# Patient Record
Sex: Female | Born: 1975 | Race: White | Hispanic: No | Marital: Married | Smoking: Never smoker
Health system: Southern US, Community
[De-identification: ages and names within clinical notes are randomized; demographics above are authoritative.]

## PROBLEM LIST (undated history)

## (undated) ENCOUNTER — Emergency Department (HOSPITAL_BASED_OUTPATIENT_CLINIC_OR_DEPARTMENT_OTHER): Admission: EM | Payer: BC Managed Care – PPO | Source: Home / Self Care

## (undated) DIAGNOSIS — A6920 Lyme disease, unspecified: Secondary | ICD-10-CM

## (undated) DIAGNOSIS — I319 Disease of pericardium, unspecified: Secondary | ICD-10-CM

## (undated) DIAGNOSIS — C50919 Malignant neoplasm of unspecified site of unspecified female breast: Secondary | ICD-10-CM

## (undated) HISTORY — DX: Lyme disease, unspecified: A69.20

## (undated) HISTORY — PX: APPENDECTOMY: SHX54

## (undated) HISTORY — DX: Disease of pericardium, unspecified: I31.9

## (undated) HISTORY — DX: Malignant neoplasm of unspecified site of unspecified female breast: C50.919

---

## 2016-02-28 HISTORY — PX: BILATERAL TOTAL MASTECTOMY WITH AXILLARY LYMPH NODE DISSECTION: SHX6364

## 2019-03-02 DIAGNOSIS — R1032 Left lower quadrant pain: Secondary | ICD-10-CM | POA: Diagnosis not present

## 2019-03-02 DIAGNOSIS — Z853 Personal history of malignant neoplasm of breast: Secondary | ICD-10-CM | POA: Diagnosis not present

## 2019-03-03 ENCOUNTER — Other Ambulatory Visit: Payer: Self-pay | Admitting: Family Medicine

## 2019-03-03 DIAGNOSIS — R1032 Left lower quadrant pain: Secondary | ICD-10-CM

## 2019-03-04 ENCOUNTER — Other Ambulatory Visit: Payer: Self-pay | Admitting: Family Medicine

## 2019-03-04 DIAGNOSIS — Z853 Personal history of malignant neoplasm of breast: Secondary | ICD-10-CM

## 2019-03-15 ENCOUNTER — Ambulatory Visit
Admission: RE | Admit: 2019-03-15 | Discharge: 2019-03-15 | Disposition: A | Discharge status: Home / Self Care | Payer: BC Managed Care – PPO | Source: Ambulatory Visit | Attending: Family Medicine | Admitting: Family Medicine

## 2019-03-15 DIAGNOSIS — R1032 Left lower quadrant pain: Secondary | ICD-10-CM | POA: Diagnosis not present

## 2019-03-15 DIAGNOSIS — R102 Pelvic and perineal pain: Secondary | ICD-10-CM | POA: Diagnosis not present

## 2019-03-22 ENCOUNTER — Other Ambulatory Visit: Payer: Self-pay

## 2019-03-25 ENCOUNTER — Ambulatory Visit
Admission: RE | Admit: 2019-03-25 | Discharge: 2019-03-25 | Disposition: A | Discharge status: Home / Self Care | Payer: BC Managed Care – PPO | Source: Ambulatory Visit | Attending: Family Medicine | Admitting: Family Medicine

## 2019-03-25 ENCOUNTER — Other Ambulatory Visit: Payer: Self-pay

## 2019-03-25 DIAGNOSIS — Z853 Personal history of malignant neoplasm of breast: Secondary | ICD-10-CM

## 2019-03-25 DIAGNOSIS — N6320 Unspecified lump in the left breast, unspecified quadrant: Secondary | ICD-10-CM | POA: Diagnosis not present

## 2019-03-25 MED ORDER — GADOBUTROL 1 MMOL/ML IV SOLN
7.0000 mL | Freq: Once | INTRAVENOUS | Status: AC | PRN
  Administered 2019-03-25: 7 mL via INTRAVENOUS

## 2019-03-29 ENCOUNTER — Other Ambulatory Visit: Payer: Self-pay | Admitting: Family Medicine

## 2019-03-29 DIAGNOSIS — N6320 Unspecified lump in the left breast, unspecified quadrant: Secondary | ICD-10-CM

## 2019-04-12 DIAGNOSIS — L720 Epidermal cyst: Secondary | ICD-10-CM | POA: Diagnosis not present

## 2019-04-20 ENCOUNTER — Ambulatory Visit
Admission: RE | Admit: 2019-04-20 | Discharge: 2019-04-20 | Disposition: A | Discharge status: Home / Self Care | Payer: BC Managed Care – PPO | Source: Ambulatory Visit | Attending: Family Medicine | Admitting: Family Medicine

## 2019-04-20 ENCOUNTER — Other Ambulatory Visit: Payer: Self-pay | Admitting: Family Medicine

## 2019-04-20 ENCOUNTER — Other Ambulatory Visit: Payer: Self-pay

## 2019-04-20 DIAGNOSIS — N6320 Unspecified lump in the left breast, unspecified quadrant: Secondary | ICD-10-CM

## 2019-04-20 DIAGNOSIS — N6321 Unspecified lump in the left breast, upper outer quadrant: Secondary | ICD-10-CM | POA: Diagnosis not present

## 2019-04-26 ENCOUNTER — Other Ambulatory Visit: Payer: Self-pay

## 2019-04-26 ENCOUNTER — Ambulatory Visit
Admission: RE | Admit: 2019-04-26 | Discharge: 2019-04-26 | Disposition: A | Discharge status: Home / Self Care | Payer: BC Managed Care – PPO | Source: Ambulatory Visit | Attending: Family Medicine | Admitting: Family Medicine

## 2019-04-26 ENCOUNTER — Other Ambulatory Visit: Payer: Self-pay | Admitting: Family Medicine

## 2019-04-26 DIAGNOSIS — N6321 Unspecified lump in the left breast, upper outer quadrant: Secondary | ICD-10-CM | POA: Diagnosis not present

## 2019-04-26 DIAGNOSIS — N6320 Unspecified lump in the left breast, unspecified quadrant: Secondary | ICD-10-CM

## 2019-04-26 DIAGNOSIS — Z853 Personal history of malignant neoplasm of breast: Secondary | ICD-10-CM | POA: Diagnosis not present

## 2019-05-25 DIAGNOSIS — R5383 Other fatigue: Secondary | ICD-10-CM | POA: Diagnosis not present

## 2019-05-25 DIAGNOSIS — Z0000 Encounter for general adult medical examination without abnormal findings: Secondary | ICD-10-CM | POA: Diagnosis not present

## 2019-05-25 DIAGNOSIS — Z13220 Encounter for screening for lipoid disorders: Secondary | ICD-10-CM | POA: Diagnosis not present

## 2019-07-07 DIAGNOSIS — Z20828 Contact with and (suspected) exposure to other viral communicable diseases: Secondary | ICD-10-CM | POA: Diagnosis not present

## 2019-07-07 DIAGNOSIS — Z20822 Contact with and (suspected) exposure to covid-19: Secondary | ICD-10-CM | POA: Diagnosis not present

## 2019-12-12 DIAGNOSIS — Z20822 Contact with and (suspected) exposure to covid-19: Secondary | ICD-10-CM | POA: Diagnosis not present

## 2019-12-15 ENCOUNTER — Telehealth (HOSPITAL_COMMUNITY): Payer: Self-pay

## 2019-12-15 ENCOUNTER — Other Ambulatory Visit: Payer: Self-pay | Admitting: Adult Health

## 2019-12-15 ENCOUNTER — Ambulatory Visit (HOSPITAL_COMMUNITY)
Admission: RE | Admit: 2019-12-15 | Discharge: 2019-12-15 | Disposition: A | Discharge status: Home / Self Care | Payer: BC Managed Care – PPO | Source: Ambulatory Visit | Attending: Pulmonary Disease | Admitting: Pulmonary Disease

## 2019-12-15 DIAGNOSIS — U071 COVID-19: Secondary | ICD-10-CM

## 2019-12-15 MED ORDER — FAMOTIDINE IN NACL 20-0.9 MG/50ML-% IV SOLN: 20.0000 mg | Freq: Once | INTRAVENOUS | Status: DC | PRN

## 2019-12-15 MED ORDER — SODIUM CHLORIDE 0.9 % IV SOLN: Freq: Once | INTRAVENOUS | Status: AC

## 2019-12-15 MED ORDER — EPINEPHRINE 0.3 MG/0.3ML IJ SOAJ: 0.3000 mg | Freq: Once | INTRAMUSCULAR | Status: DC | PRN

## 2019-12-15 MED ORDER — DIPHENHYDRAMINE HCL 50 MG/ML IJ SOLN: 50.0000 mg | Freq: Once | INTRAMUSCULAR | Status: DC | PRN

## 2019-12-15 MED ORDER — METHYLPREDNISOLONE SODIUM SUCC 125 MG IJ SOLR: 125.0000 mg | Freq: Once | INTRAMUSCULAR | Status: DC | PRN

## 2019-12-15 MED ORDER — ALBUTEROL SULFATE HFA 108 (90 BASE) MCG/ACT IN AERS: 2.0000 | INHALATION_SPRAY | Freq: Once | RESPIRATORY_TRACT | Status: DC | PRN

## 2019-12-15 MED ORDER — ONDANSETRON HCL 4 MG/2ML IJ SOLN
4.0000 mg | Freq: Once | INTRAMUSCULAR | Status: AC
  Administered 2019-12-15: 4 mg via INTRAVENOUS
  Filled 2019-12-15: qty 2

## 2019-12-15 MED ORDER — SODIUM CHLORIDE 0.9 % IV SOLN: INTRAVENOUS | Status: DC | PRN

## 2019-12-15 NOTE — Telephone Encounter (Signed)
Called to Discuss with patient about Covid symptoms and the use of the monoclonal antibody infusion for those with mild to moderate Covid symptoms and at a high risk of hospitalization.     Pt appears to qualify for this infusion due to co-morbid conditions and/or a member of an at-risk group in accordance with the FDA Emergency Use Authorization.    Pt stated her symptoms started on 12/09/19 and she tested positive at [facility] on 12/12/[facility]. Pt states symptoms include fever, chills, & body aches. Pt states her medical history includes breast cancer, pericarditis, and lyme disease. RN informed pt that an APP will call and schedule an appointment.

## 2019-12-15 NOTE — Discharge Instructions (Signed)

## 2019-12-15 NOTE — Progress Notes (Signed)
  Diagnosis: COVID-19  Physician: Dr. Wright  Procedure: Covid Infusion Clinic Med: bamlanivimab\etesevimab infusion - Provided patient with bamlanimivab\etesevimab fact sheet for patients, parents and caregivers prior to infusion.  Complications: No immediate complications noted.  Discharge: Discharged home   Adriana Phelps 12/15/2019  

## 2019-12-15 NOTE — Progress Notes (Signed)
I connected by phone with Adriana Phelps on 12/15/2019 at 3:11 PM to discuss the potential use of a new treatment for mild to moderate COVID-19 viral infection in non-hospitalized patients.  This patient is a 44 y.o. female that meets the FDA criteria for Emergency Use Authorization of COVID monoclonal antibody casirivimab/imdevimab or bamlanivimab/eteseviamb.  Has a (+) direct SARS-CoV-2 viral test result  Has mild or moderate COVID-19   Is NOT hospitalized due to COVID-19  Is within 10 days of symptom onset  Has at least one of the high risk factor(s) for progression to severe COVID-19 and/or hospitalization as defined in EUA.  Specific high risk criteria : BMI > 25   I have spoken and communicated the following to the patient or parent/caregiver regarding COVID monoclonal antibody treatment:  1. FDA has authorized the emergency use for the treatment of mild to moderate COVID-19 in adults and pediatric patients with positive results of direct SARS-CoV-2 viral testing who are 12 years of age and older weighing at least 40 kg, and who are at high risk for progressing to severe COVID-19 and/or hospitalization.  2. The significant known and potential risks and benefits of COVID monoclonal antibody, and the extent to which such potential risks and benefits are unknown.  3. Information on available alternative treatments and the risks and benefits of those alternatives, including clinical trials.  4. Patients treated with COVID monoclonal antibody should continue to self-isolate and use infection control measures (e.g., wear mask, isolate, social distance, avoid sharing personal items, clean and disinfect "high touch" surfaces, and frequent handwashing) according to CDC guidelines.   5. The patient or parent/caregiver has the option to accept or refuse COVID monoclonal antibody treatment.  After reviewing this information with the patient, the patient has agreed to receive one of the available  covid 19 monoclonal antibodies and will be provided an appropriate fact sheet prior to infusion. Scot Dock, NP 12/15/2019 3:11 PM

## 2020-04-15 IMAGING — US US BREAST*L* LIMITED INC AXILLA
1 series · 8 of 8 positions shown · non-contrast
Comparison: Breast MRI March 25, 2019

CLINICAL DATA: This is a second-look ultrasound from an MRI dated
March 25, 2019.

EXAM:
ULTRASOUND OF THE LEFT BREAST

[Series 1: us breast*left* limited inc axilla · 0.03mm/px · 8 of 8 slices shown]
[im 1/8]
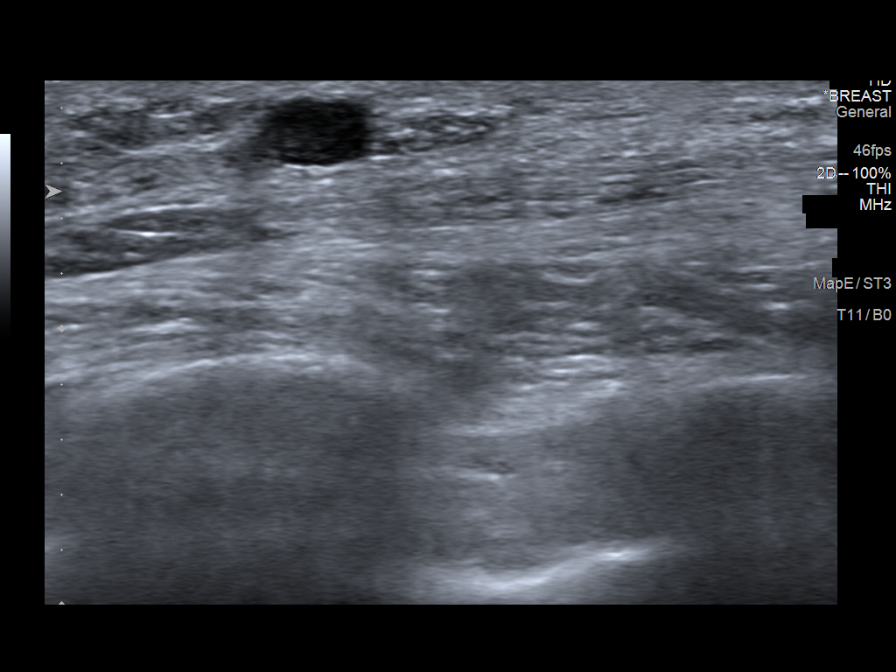
[im 2/8]
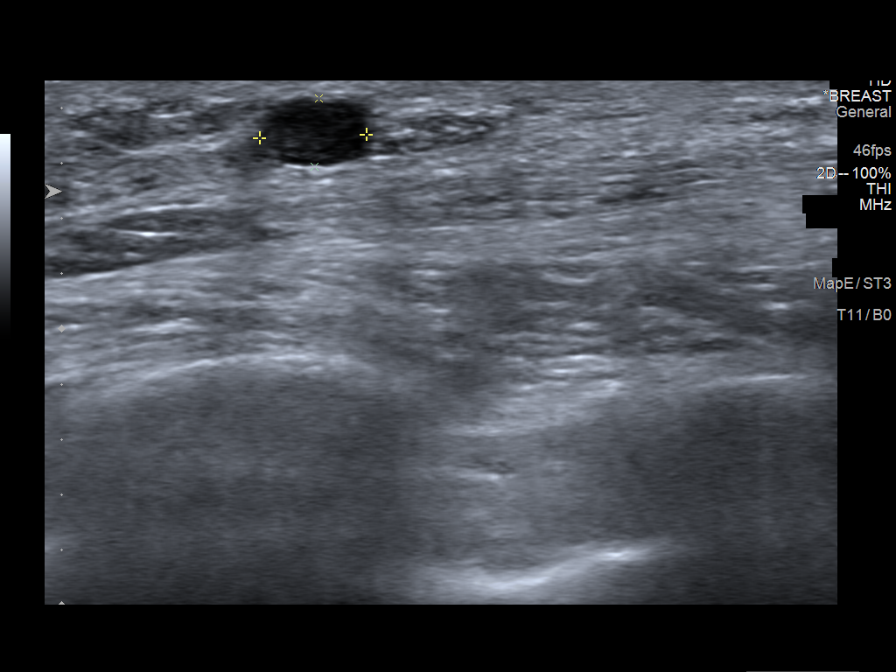
[im 3/8]
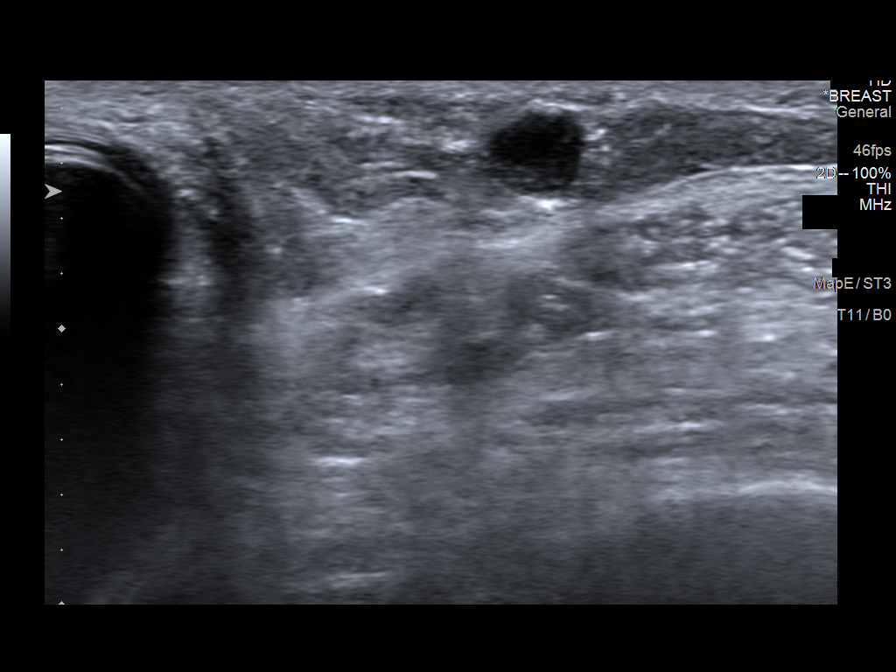
[im 4/8]
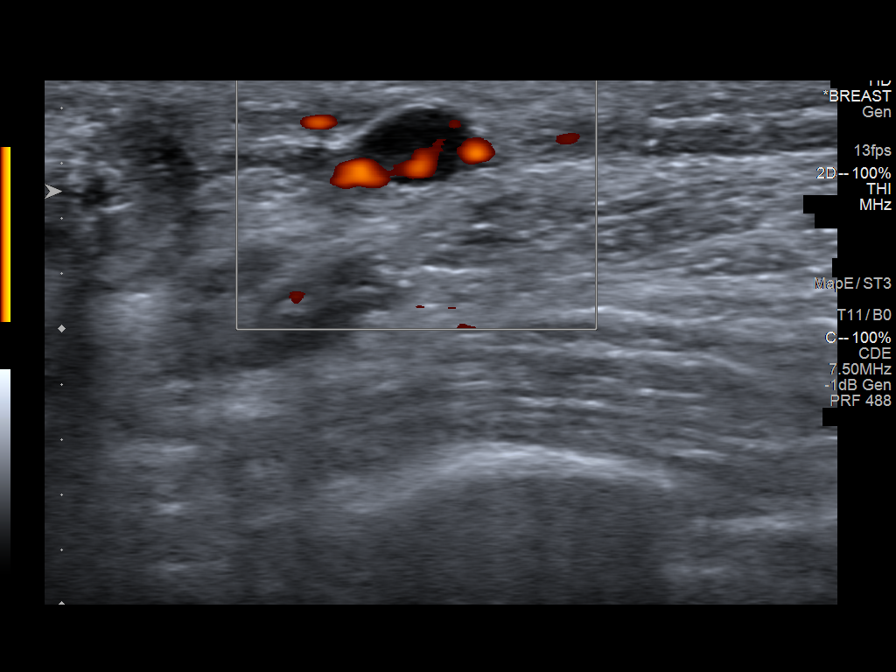
[im 5/8]
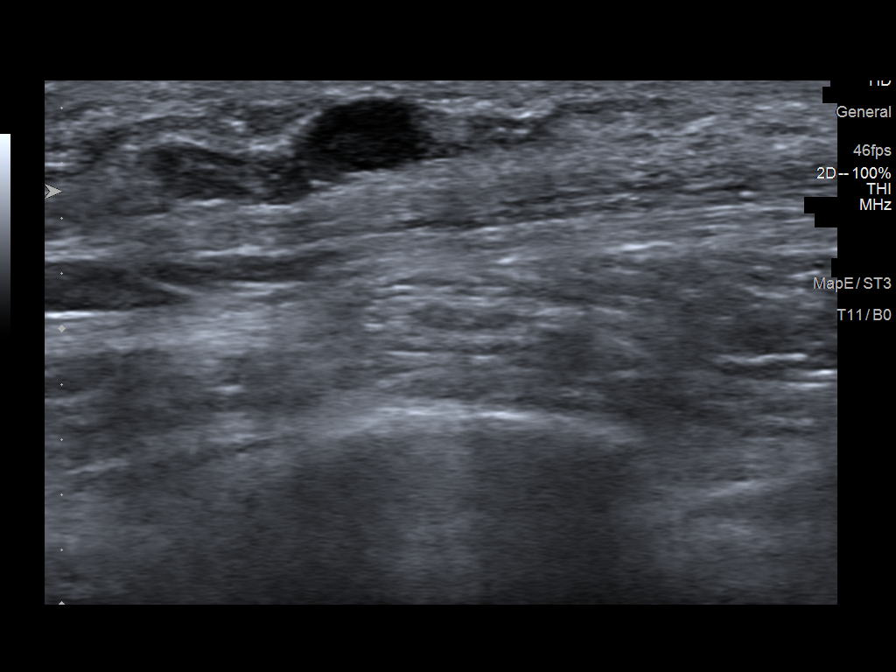
[im 6/8]
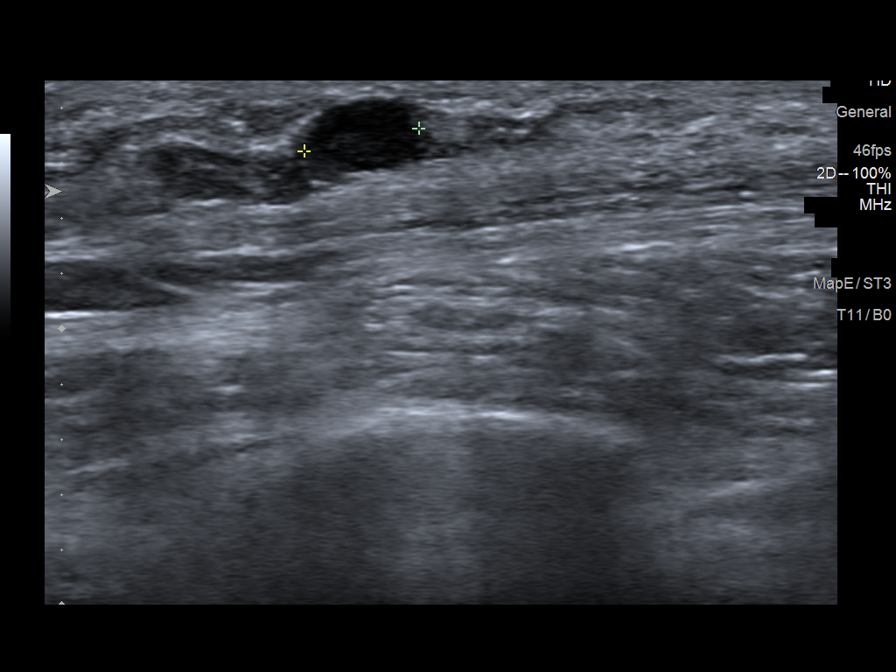
[im 7/8]
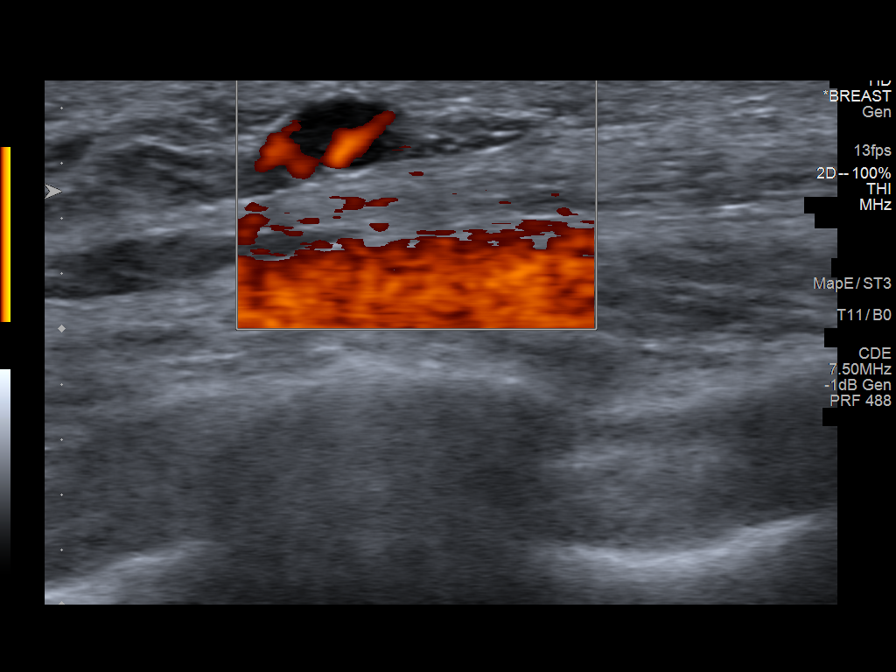
[im 8/8]
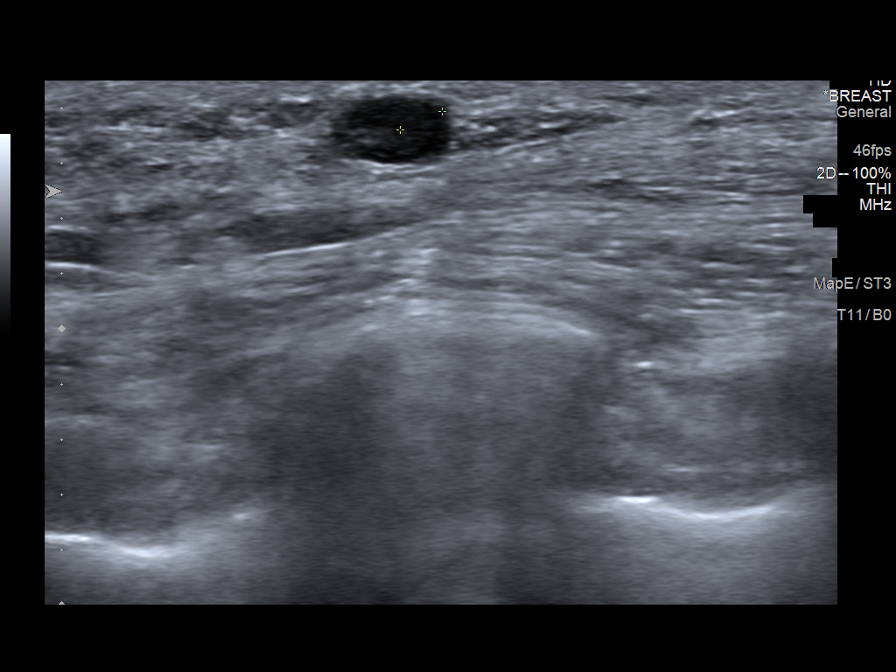

[8 of 8 positions shown; findings below may reference images not displayed]

FINDINGS: On physical exam, no suspicious lumps are identified.

Targeted ultrasound is performed, showing a primarily hypoechoic
mass in the left breast at [DATE], 12 cm from the nipple correlating
with the MRI finding. On some images, there is suggestion of a very
faint fatty hilum although this is not 100% certain.
IMPRESSION: The mass in the left breast at [DATE], 12 cm from the nipple which
correlates with the MRI finding is favored to represent a lymph
node. However, the ultrasound findings are not definitive.

RECOMMENDATION:
Recommend ultrasound-guided biopsy of the left breast mass at [DATE],
12 cm from the nipple.

I have discussed the findings and recommendations with the patient.
If applicable, a reminder letter will be sent to the patient
regarding the next appointment.

BI-RADS CATEGORY  4: Suspicious.

## 2020-04-25 ENCOUNTER — Telehealth: Payer: Self-pay | Admitting: Oncology

## 2020-04-25 NOTE — Telephone Encounter (Signed)
Received a new pt referral from [place] [facility] for hx of breast cancer. Pt returned my call to schedule a new pt appt w/Dr. Jana Hakim on 3/21 at 4pm. Pt aware to arrive 20 minutes early.

## 2020-05-12 ENCOUNTER — Encounter: Payer: Self-pay | Admitting: Oncology

## 2020-05-12 NOTE — Progress Notes (Signed)
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 She underwent bilateral nipple sparing mastectomies and sentinel lymph node sampling on 02/28/2016 under Dr. Salina April with silicone implants placed at that time. Final pathology showed stage IA (pT1b, pN0, M0) disease. Report:  1. Right Breast  - invasive ductal carcinoma, grade 1, 1 cm   - intermediate grade ductal carcinoma in situ  - surgical margins negative  - prognostic panel: estrogen receptor 97% positive with moderate staining intensity; progesterone receptor 98% positive with strong staining intensity; proliferation marker Ki-67 of 19%; Her2 negative (0)   two right axillary lymph nodes negative  2. Left Breast  - fibroadenoma, 1.1 cm  - benign breast parenchyma with nodular sclerosis   two left Axillary Lymph Nodes, negative for carcinoma (0/2)  She reports she had an Oncotype which quoted her a '3% risk" (this would be her risk of recurrence outside the breast assuming she took antiestrogens for 5 years) and did not receive any chemotherapy.  She met with a medical oncologist and was offered antiestrogens, but she felt the risk of recurrence was low enough and her history of reactions to similar medicines in the past were high enough she decided against antiestrogens  Cancer Staging Malignant neoplasm of central portion of right breast in female,  estrogen receptor positive ([place]) Staging form: Breast, AJCC 8th Edition - Clinical: cT1b, cN0, cM0, G1, ER+, PR+, HER2: Not Assessed - Signed by Chauncey Cruel, MD on 05/14/2020 Stage prefix: Initial diagnosis Histologic grading system: 3 grade system  More recently, she had bilateral breast MRI 03/25/2019 which showed a 0.7 cm enhancing mass in the posterior lateral left breast, identified also on ultrasound.  She underwent ultrasound-guided left breast core biopsy on 03/03/2021which showed benign lymph node tissue.    The patient's subsequent history is as detailed below.   INTERVAL HISTORY: Adriana Phelps was evaluated in the breast cancer clinic on 05/14/2020 .  REVIEW OF SYSTEMS: The patient denies unusual headaches, visual changes, nausea, vomiting, stiff neck, dizziness, or gait imbalance. There has been no cough, phlegm production, or pleurisy, no chest pain or pressure, and no change in bowel or bladder habits. The patient denies fever, rash, bleeding, unexplained fatigue or unexplained weight loss.  She does not have any soreness, sensitivity, or shooting pains in either breast.  She exercises chiefly by walking, usually 30 minutes at least 5 days a week.  A detailed review of systems was otherwise entirely negative.   COVID 19 VACCINATION STATUS: Refuses vaccination, status post infection 12/12/2019   PAST MEDICAL HISTORY: Past Medical History:  Diagnosis Date   Breast cancer ([place])    Lyme disease    Pericarditis     PAST SURGICAL HISTORY: Past Surgical History:  Procedure Laterality Date   APPENDECTOMY     BILATERAL TOTAL MASTECTOMY WITH AXILLARY LYMPH NODE DISSECTION  02/28/2016   Dr. Salina April in [place], with immediate reconstruction by Dr. Fidela Salisbury   CESAREAN SECTION  2005  FAMILY HISTORY: Family History  Problem Relation Age of Onset   Breast cancer Maternal Aunt    Her parents are both li in their 52s as of 04/2020. She has one brother (and no  sisters). She reports breast cancer in a maternal aunt at age 37.  There is no history of prostate, pancreas, or ovarian cancer in the family to her knowledge   GYNECOLOGIC HISTORY:  No LMP recorded. Menarche: 45 years old Age at first live birth: 45 years old Adriana Phelps 3 LMP current, irregular Contraceptive: IUD in place (Paragard) HRT n/a  Hysterectomy? no BSO? 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  She will go into a home and basically organize everything for example.  Her husband Adriana Phelps is an outside Press photographer at [employer].  Their children are Adriana Phelps, 18, Adriana Phelps, 16, and Adriana Phelps, 8.  The patient attends the [employer].    ADVANCED DIRECTIVES: In the absence of any documentation to the contrary, the patient's spouse is their HCPOA.    HEALTH MAINTENANCE: Social History   Tobacco Use   Smoking status: Never Smoker     Colonoscopy: n/a (age)  PAP: 2019, negative  Bone density:   No Known Allergies  Current Outpatient Medications  Medication Sig Dispense Refill   MELATONIN GUMMIES PO Take 10-15 mg by mouth at bedtime as needed.     chlorhexidine (PERIDEX) 0.12 % solution PLEASE SEE ATTACHED FOR DETAILED DIRECTIONS     No current facility-administered medications for this visit.    OBJECTIVE: White woman who appears stated age  45:   05/14/20 1607  BP: (!) 103/48  Pulse: 66  Resp: 18  Temp: 97.8 F (36.6 C)  SpO2: 100%     There is no height or weight on file to calculate BMI.   Wt Readings from Last 3 Encounters:  05/14/20 128 lb 14.4 oz (58.5 kg)      ECOG FS:1 - Symptomatic but completely ambulatory  Ocular: Sclerae unicteric, pupils round and equal Ear-nose-throat: Wearing a mask Lymphatic: No cervical or supraclavicular adenopathy Lungs no rales or rhonchi Heart regular rate and rhythm Abd soft, nontender, positive bowel sounds MSK no focal spinal tenderness, no joint edema Neuro: non-focal, well-oriented,  appropriate affect Breasts: Status post bilateral nipple sparing mastectomies.  There is minimal irregularity in the implants.  There is no evidence of local recurrence by exam.  Both axillae are benign.   LAB RESULTS:  CMP  No results found for: NA, K, CL, CO2, GLUCOSE, BUN, CREATININE, CALCIUM, PROT, ALBUMIN, AST, ALT, ALKPHOS, BILITOT, GFRNONAA, GFRAA  No results found for: TOTALPROTELP, ALBUMINELP, A1GS, A2GS, BETS, BETA2SER, GAMS, MSPIKE, SPEI  No results found for: WBC, NEUTROABS, HGB, HCT, MCV, PLT  No results found for: LABCA2  No components found for: [id number]  No results for input(s): INR in the last 168 hours.  No results found for: LABCA2  No results found for: NWG956  No results found for: OZH086  No results found for: VHQ469  No results found for: CA2729  No components found for: HGQUANT  No results found for: CEA1 / No results found for: CEA1   No results found for: AFPTUMOR  No results found for: CHROMOGRNA  No results found for: KPAFRELGTCHN, LAMBDASER, KAPLAMBRATIO (kappa/lambda light chains)  No results found for: HGBA, HGBA2QUANT, HGBFQUANT, HGBSQUAN (Hemoglobinopathy evaluation)   No results found for: LDH  No results found for: IRON, TIBC, IRONPCTSAT (Iron and TIBC)  No results found for:  FERRITIN  Urinalysis No results found for: COLORURINE, APPEARANCEUR, LABSPEC, PHURINE, GLUCOSEU, HGBUR, BILIRUBINUR, KETONESUR, PROTEINUR, UROBILINOGEN, NITRITE, LEUKOCYTESUR   STUDIES: No results found.   ELIGIBLE FOR AVAILABLE RESEARCH PROTOCOL: no  ASSESSMENT: 45 y.o. [place] woman status post right breast biopsy 01/03/2016 for a clinical T1N0 invasive ductal carcinoma, grade 1, estrogen and progesterone receptor positive, HER-2 not amplified, with an MIB-1 of 19%  (1) status post bilateral nipple sparing mastectomies 02/28/2016 showing  (a) on the left, no malignancy; 2 lymph nodes removed  (b) on the right, a pT1b pN0, stage IA invasive  ductal carcinoma, grade 1, with negative margins; 2 right axillary lymph nodes removed  (2) left breast biopsy 04/27/2019 showed only a benign lymph node.    PLAN: I met today with Adriana Phelps to review her new diagnosis. Specifically we discussed the biology of her breast cancer, its diagnosis, staging, treatment  options and prognosis.  If I had seen her initially I would to have offered her antiestrogens and she was indeed offered tamoxifen but opted against it.  We reviewed the fact that antiestrogens cut the risk of breast cancer in half and there are 3 separate risks.  There is the risk of the original cancer recurring in the breast and the risk of the original cancer recurring outside the breast.  Antiestrogens started within a reasonable period after the original surgery and continued for 5 years cut those risks (which were low in her case) in half.  However I have no data that starting antiestrogens now would lower the risk of this cancer recurring.  Antiestrogens also cut in half the risk of a future breast cancer developing in either breast.  However she has essentially no breast issue at present.  In short I do not see any indication for antiestrogens in her case  The issue of MRIs after bilateral mastectomies with silicone reconstruction is more complex.  Guidelines do suggest MRI 3 years after the original biopsy and every 2 years thereafter to look for implant rupture.  However since we know that implant rupture is not a cause of autoimmune disease or other systemic problems the underlying thought behind this indication seems questionable. The patient is concerned about cost issues--her insurance is not paying for more MRIs at this point--and also false positives (which she just experienced). She woud prefer to be followed with observation alone.  I am comfortable following Adriana Phelps with exam alone understanding that at some point in the future she might indeed have to have her reconstruction  revised.  With that in mind I am referring her to plastic surgery.  Finally she also needs a gynecologist.  I would prefer she would be on a nonhormonal implant and that can be discussed at the time of the referral  Normally I release my patients from follow-up at the 5-year mark so I would normally do the same with Adriana Phelps but I offered her a visit to our survivorship program next year.  After that she can decide whether or not she is comfortable simply being followed by plastics and gynecology in addition to her primary care physician  Total encounter time 60 minutes.  Virgie Dad. Manraj Yeo, MD 05/14/2020 5:33 PM Medical Oncology and Hematology [facility] [place], [place] [address] Tel. [phone]    Fax. [phone]   This document serves as a record of services personally performed by Lurline Del, MD. It was created on his behalf by Wilburn Mylar, a trained medical scribe. The creation  of this record is based on the scribe's personal observations and the provider's statements to them.   I, Lurline Del MD, have reviewed the above documentation for accuracy and completeness, and I agree with the above.    *Total Encounter Time as defined by the Centers for Medicare and Medicaid Services includes, in addition to the face-to-face time of a patient visit (documented in the note above) non-face-to-face time: obtaining and reviewing outside history, ordering and reviewing medications, tests or procedures, care coordination (communications with other health care professionals or caregivers) and documentation in the medical record.

## 2020-05-14 ENCOUNTER — Inpatient Hospital Stay: Payer: BC Managed Care – PPO | Attending: Oncology | Admitting: Oncology

## 2020-05-14 ENCOUNTER — Encounter: Payer: Self-pay | Admitting: Oncology

## 2020-05-14 ENCOUNTER — Other Ambulatory Visit: Payer: Self-pay

## 2020-05-14 DIAGNOSIS — Z9013 Acquired absence of bilateral breasts and nipples: Secondary | ICD-10-CM | POA: Insufficient documentation

## 2020-05-14 DIAGNOSIS — D242 Benign neoplasm of left breast: Secondary | ICD-10-CM | POA: Insufficient documentation

## 2020-05-14 DIAGNOSIS — C50111 Malignant neoplasm of central portion of right female breast: Secondary | ICD-10-CM | POA: Insufficient documentation

## 2020-05-14 DIAGNOSIS — Z170 Estrogen receptor positive status [ER+]: Secondary | ICD-10-CM

## 2020-05-15 ENCOUNTER — Telehealth: Payer: Self-pay | Admitting: Oncology

## 2020-05-15 NOTE — Telephone Encounter (Signed)
Scheduled appt per 3/21 los. Called pt, no answer.Left vm with appt date and time.

## 2020-05-16 ENCOUNTER — Telehealth (HOSPITAL_BASED_OUTPATIENT_CLINIC_OR_DEPARTMENT_OTHER): Payer: Self-pay | Admitting: Obstetrics & Gynecology

## 2020-05-16 NOTE — Telephone Encounter (Signed)
Called and left a message to call the office to schedule new patient appointment .

## 2020-05-29 ENCOUNTER — Encounter: Payer: Self-pay | Admitting: Oncology

## 2020-07-27 DIAGNOSIS — Z853 Personal history of malignant neoplasm of breast: Secondary | ICD-10-CM | POA: Insufficient documentation

## 2020-07-30 DIAGNOSIS — Z9013 Acquired absence of bilateral breasts and nipples: Secondary | ICD-10-CM | POA: Diagnosis not present

## 2020-07-30 DIAGNOSIS — Z853 Personal history of malignant neoplasm of breast: Secondary | ICD-10-CM | POA: Diagnosis not present

## 2020-09-10 ENCOUNTER — Ambulatory Visit (HOSPITAL_BASED_OUTPATIENT_CLINIC_OR_DEPARTMENT_OTHER): Payer: BC Managed Care – PPO | Admitting: Obstetrics & Gynecology

## 2020-11-05 ENCOUNTER — Other Ambulatory Visit: Payer: Self-pay

## 2020-11-05 ENCOUNTER — Other Ambulatory Visit (HOSPITAL_COMMUNITY)
Admission: RE | Admit: 2020-11-05 | Discharge: 2020-11-05 | Disposition: A | Discharge status: Home / Self Care | Payer: BC Managed Care – PPO | Source: Ambulatory Visit | Attending: Obstetrics & Gynecology | Admitting: Obstetrics & Gynecology

## 2020-11-05 ENCOUNTER — Ambulatory Visit (HOSPITAL_BASED_OUTPATIENT_CLINIC_OR_DEPARTMENT_OTHER): Payer: BC Managed Care – PPO | Admitting: Obstetrics & Gynecology

## 2020-11-05 ENCOUNTER — Encounter (HOSPITAL_BASED_OUTPATIENT_CLINIC_OR_DEPARTMENT_OTHER): Payer: Self-pay | Admitting: Obstetrics & Gynecology

## 2020-11-05 VITALS — BP 109/68 | HR 84 | Ht 64.0 in | Wt 128.4 lb

## 2020-11-05 DIAGNOSIS — Z853 Personal history of malignant neoplasm of breast: Secondary | ICD-10-CM

## 2020-11-05 DIAGNOSIS — Z1211 Encounter for screening for malignant neoplasm of colon: Secondary | ICD-10-CM | POA: Diagnosis not present

## 2020-11-05 DIAGNOSIS — Z124 Encounter for screening for malignant neoplasm of cervix: Secondary | ICD-10-CM | POA: Diagnosis not present

## 2020-11-05 DIAGNOSIS — Z01419 Encounter for gynecological examination (general) (routine) without abnormal findings: Secondary | ICD-10-CM

## 2020-11-05 DIAGNOSIS — Z9013 Acquired absence of bilateral breasts and nipples: Secondary | ICD-10-CM

## 2020-11-05 NOTE — Progress Notes (Signed)
45 y.o. G3P3 Married White or Caucasian female here for annual exam.  H/o breast cancer in 2018.  Had bilateral mastectomy.  Did not take tamoxifen.  Did not need radiation or chemotherapy.  Released by Dr. Jana Hakim.  Had breast MRI last year.  Had a finding that was biopsied and was a benign lymph node.  She and Dr. Jana Hakim had discussion about breast MRI.  She does not want to proceed with any additional breast screening/testing at this time.   Oldest is a Equities trader in high school.  He is being recruited by [employer] for wrestling.  They are heading to [employer].     Had a paragard IUD placed for contraception.  This was placed around 2019.  Cycles are ever 21 days.  Flow can be light or can be heavy.  Happy with current form of contraception.            Sexually active: No.  The current method of family planning is IUD.    Exercising: Yes.     Walks regularly  Smoker:  no  Health Maintenance: Pap:  obtained today History of abnormal Pap:  no MMG:  not indicated Colonoscopy:  guidelines reviewed.  Plan cologuard this year. TDaP:  declines Shingrix:   declines Hep C testing:  Screening Labs: done with PCP   reports that she has never smoked. She has never used smokeless tobacco. She reports current alcohol use of about 1.0 standard drink per week. She reports that she does not use drugs.  Past Medical History:  Diagnosis Date   Breast cancer ([place])    Breast disorder    Lyme disease    Pericarditis     Past Surgical History:  Procedure Laterality Date   APPENDECTOMY     BILATERAL TOTAL MASTECTOMY WITH AXILLARY LYMPH NODE DISSECTION  02/28/2016   Dr. Salina April in [place], with immediate reconstruction by Dr. Fidela Salisbury, negative genetic testing   CESAREAN SECTION  2005    Current Outpatient Medications  Medication Sig Dispense Refill   chlorhexidine (PERIDEX) 0.12 % solution PLEASE SEE ATTACHED FOR DETAILED DIRECTIONS     MELATONIN GUMMIES PO Take 10-15 mg by mouth at  bedtime as needed.     No current facility-administered medications for this visit.    Family History  Problem Relation Age of Onset   Breast cancer Maternal Aunt     Review of Systems  All other systems reviewed and are negative.  Exam:   BP 109/68 (BP Location: Left Arm, Patient Position: Sitting, Cuff Size: Small)   Pulse 84   Ht '5\' 4"'$  (1.626 m)   Wt 128 lb 6.4 oz (58.2 kg)   BMI 22.04 kg/m   Height: '5\' 4"'$  (162.6 cm)  General appearance: alert, cooperative and appears stated age Head: Normocephalic, without obvious abnormality, atraumatic Neck: no adenopathy, supple, symmetrical, trachea midline and thyroid normal to inspection and palpation Lungs: clear to auscultation bilaterally Breasts: normal appearance, no masses or tenderness Heart: regular rate and rhythm Abdomen: soft, non-tender; bowel sounds normal; no masses,  no organomegaly Extremities: extremities normal, atraumatic, no cyanosis or edema Skin: Skin color, texture, turgor normal. No rashes or lesions Lymph nodes: Cervical, supraclavicular, and axillary nodes normal. No abnormal inguinal nodes palpated Neurologic: Grossly normal  Pelvic: External genitalia:  no lesions              Urethra:  normal appearing urethra with no masses, tenderness or lesions  Bartholins and Skenes: normal                 Vagina: normal appearing vagina with normal color and no discharge, no lesions              Cervix: no lesions              Pap taken: Yes.   Bimanual Exam:  Uterus:  normal size, contour, position, consistency, mobility, non-tender              Adnexa: normal adnexa and no mass, fullness, tenderness               Rectovaginal: Confirms               Anus:  normal sphincter tone, no lesions  Chaperone, Octaviano Batty, CMA, was present for exam.  Assessment/Plan: 1. Well woman exam with routine gynecological exam - pap and high risk HPV obtained today - MMG not indicated - Cologuard ordered -  Plan BMD in pt's 23's - lab work done with DR. Masneri - vaccines reviewed/updated  2. Cervical cancer screening - Cytology - PAP( [place])  3. Colon cancer screening - Cologuard  4. Personal history of breast cancer  5. History of bilateral mastectomy

## 2020-11-08 LAB — CYTOLOGY - PAP
Adequacy: ABSENT
Comment: NEGATIVE
Diagnosis: NEGATIVE
High risk HPV: NEGATIVE

## 2021-04-17 ENCOUNTER — Telehealth: Payer: Self-pay | Admitting: Adult Health

## 2021-04-17 NOTE — Telephone Encounter (Signed)
Called patient to update her on the changes made to her upcoming appointment. Left message. Patient will be mailed an updated calendar.

## 2021-05-16 ENCOUNTER — Inpatient Hospital Stay: Payer: BC Managed Care – PPO | Attending: Adult Health | Admitting: Adult Health

## 2022-11-06 DIAGNOSIS — J069 Acute upper respiratory infection, unspecified: Secondary | ICD-10-CM | POA: Diagnosis not present

## 2022-11-06 DIAGNOSIS — J029 Acute pharyngitis, unspecified: Secondary | ICD-10-CM | POA: Diagnosis not present

## 2022-11-06 DIAGNOSIS — Z03818 Encounter for observation for suspected exposure to other biological agents ruled out: Secondary | ICD-10-CM | POA: Diagnosis not present

## 2022-11-06 DIAGNOSIS — R051 Acute cough: Secondary | ICD-10-CM | POA: Diagnosis not present

## 2023-01-02 ENCOUNTER — Telehealth (HOSPITAL_BASED_OUTPATIENT_CLINIC_OR_DEPARTMENT_OTHER): Payer: Self-pay | Admitting: Certified Nurse Midwife

## 2023-01-02 ENCOUNTER — Ambulatory Visit (HOSPITAL_BASED_OUTPATIENT_CLINIC_OR_DEPARTMENT_OTHER): Payer: BC Managed Care – PPO | Admitting: Obstetrics & Gynecology

## 2023-01-02 NOTE — Telephone Encounter (Signed)
Called patient and left a message to call the office back about today's appt  that was for 1055.

## 2023-09-08 DIAGNOSIS — R232 Flushing: Secondary | ICD-10-CM | POA: Diagnosis not present

## 2023-09-08 DIAGNOSIS — Z780 Asymptomatic menopausal state: Secondary | ICD-10-CM | POA: Diagnosis not present

## 2023-09-18 DIAGNOSIS — Z780 Asymptomatic menopausal state: Secondary | ICD-10-CM | POA: Diagnosis not present

## 2023-09-18 DIAGNOSIS — R202 Paresthesia of skin: Secondary | ICD-10-CM | POA: Diagnosis not present

## 2023-09-18 DIAGNOSIS — Z6829 Body mass index (BMI) 29.0-29.9, adult: Secondary | ICD-10-CM | POA: Diagnosis not present

## 2023-09-18 DIAGNOSIS — M79651 Pain in right thigh: Secondary | ICD-10-CM | POA: Diagnosis not present

## 2023-09-18 DIAGNOSIS — E559 Vitamin D deficiency, unspecified: Secondary | ICD-10-CM | POA: Diagnosis not present

## 2023-09-18 DIAGNOSIS — M2550 Pain in unspecified joint: Secondary | ICD-10-CM | POA: Diagnosis not present

## 2023-12-31 DIAGNOSIS — Z0489 Encounter for examination and observation for other specified reasons: Secondary | ICD-10-CM | POA: Diagnosis not present

## 2023-12-31 DIAGNOSIS — Z6829 Body mass index (BMI) 29.0-29.9, adult: Secondary | ICD-10-CM | POA: Diagnosis not present
